# Patient Record
Sex: Male | Born: 1965 | Hispanic: No | Marital: Married | Smoking: Never smoker
Health system: Southern US, Community
[De-identification: ages and names within clinical notes are randomized; demographics above are authoritative.]

## PROBLEM LIST (undated history)

## (undated) DIAGNOSIS — I10 Essential (primary) hypertension: Secondary | ICD-10-CM

## (undated) DIAGNOSIS — Z85818 Personal history of malignant neoplasm of other sites of lip, oral cavity, and pharynx: Secondary | ICD-10-CM

## (undated) DIAGNOSIS — Z923 Personal history of irradiation: Secondary | ICD-10-CM

## (undated) HISTORY — DX: Personal history of irradiation: Z92.3

## (undated) HISTORY — DX: Essential (primary) hypertension: I10

## (undated) HISTORY — PX: PAROTIDECTOMY: SHX2163

## (undated) HISTORY — DX: Personal history of malignant neoplasm of other sites of lip, oral cavity, and pharynx: Z85.818

---

## 2014-04-22 ENCOUNTER — Ambulatory Visit: Payer: Self-pay | Admitting: General Practice

## 2015-11-11 ENCOUNTER — Ambulatory Visit (INDEPENDENT_AMBULATORY_CARE_PROVIDER_SITE_OTHER): Payer: 59

## 2015-11-11 ENCOUNTER — Ambulatory Visit (INDEPENDENT_AMBULATORY_CARE_PROVIDER_SITE_OTHER): Payer: 59 | Admitting: Podiatry

## 2015-11-11 ENCOUNTER — Encounter: Payer: Self-pay | Admitting: Podiatry

## 2015-11-11 VITALS — BP 141/89 | HR 83 | Resp 18

## 2015-11-11 DIAGNOSIS — M722 Plantar fascial fibromatosis: Secondary | ICD-10-CM

## 2015-11-11 DIAGNOSIS — R52 Pain, unspecified: Secondary | ICD-10-CM

## 2015-11-11 NOTE — Progress Notes (Signed)
   Subjective:    Patient ID: Jerome Villarreal, male    DOB: [birth year], 50 y.o.   MRN: [id number]  HPI  50 year old male presents the office they for concerns of right arch pain as well as heel pain which has been ongoing for about 1 week. He states he has pain only the mornings he first gets up and is better after he walks. He is going not experiencing any pain. No recent injury or trauma. No swelling or redness. No numbness or tingling. No treatment. No other complaints.   Review of Systems  All other systems reviewed and are negative.      Objective:   Physical Exam General: AAO x3, NAD  Dermatological: Skin is warm, dry and supple bilateral. Nails x 10 are well manicured; remaining integument appears unremarkable at this time. There are no open sores, no preulcerative lesions, no rash or signs of infection present.  Vascular: Dorsalis Pedis artery and Posterior Tibial artery pedal pulses are 2/4 bilateral with immedate capillary fill time. Pedal hair growth present. No varicosities and no lower extremity edema present bilateral. There is no pain with calf compression, swelling, warmth, erythema.   Neruologic: Grossly intact via light touch bilateral. Vibratory intact via tuning fork bilateral. Protective threshold with Semmes Wienstein monofilament intact to all pedal sites bilateral. Patellar and Achilles deep tendon reflexes 2+ bilateral. No Babinski or clonus noted bilateral.   Musculoskeletal: This time there is no tenderness the right foot. Subjectively along the medial band of the plantar fascia within the arch of the foot is were he gets discomfort as well as the plantar medial tubercle of the calcaneus at the insertion of plantar fascia however again he has night splint and pain into these levels. There is no edema, erythema, increase in warmth. There is no pain with lateral compression of the calcaneus. MMT 5/5. Equinus is present.  Gait: Unassisted, Nonantalgic.         Assessment & Plan:  50 year old male right plantar fasciitis, arch pain -Treatment options discussed including all alternatives, risks, and complications -Etiology of symptoms were discussed -X-rays were obtained and reviewed with the patient. No evidence of acute fracture. -At this time since he is not having pain hold off on steroid injection. -I discussed with him shoe gear modifications and orthotics to help better control his foot type. -Recommended daily stretching exercises I dispensed night splint. -Anti-inflammatories as needed. He did not wish to have a prescription for this. -Follow-up as scheduled or sooner if needed.  Ovid CurdMatthew Wagoner, DPM

## 2015-11-11 NOTE — Patient Instructions (Signed)

## 2015-11-15 DIAGNOSIS — M722 Plantar fascial fibromatosis: Secondary | ICD-10-CM | POA: Insufficient documentation

## 2015-12-14 ENCOUNTER — Ambulatory Visit (INDEPENDENT_AMBULATORY_CARE_PROVIDER_SITE_OTHER): Payer: 59 | Admitting: Podiatry

## 2015-12-14 ENCOUNTER — Encounter: Payer: Self-pay | Admitting: Podiatry

## 2015-12-14 VITALS — BP 134/88 | HR 69 | Resp 12

## 2015-12-14 DIAGNOSIS — M79673 Pain in unspecified foot: Secondary | ICD-10-CM

## 2015-12-14 DIAGNOSIS — M722 Plantar fascial fibromatosis: Secondary | ICD-10-CM

## 2015-12-14 NOTE — Progress Notes (Signed)
Patient ID: Jerome Villarreal, male   DOB: December 14, 1965, 50 y.o.   MRN: [id number]  Subjective: [age] male presents the office they for follow-up evaluation of right arch, heel pain. He states that he has not had any pain to his foot and the last couple weeks. He was stretching icing as well as wearing the splint for the first couple weeks however since his pain has subsided he has discontinued this. He did purchase power steps and this has been helping quite a bit.Denies any systemic complaints such as fevers, chills, nausea, vomiting. No acute changes since last appointment, and no other complaints at this time.   Objective: AAO x3, NAD DP/PT pulses palpable bilaterally, CRT less than 3 seconds There is no enderness to palpation along the plantar medial tubercle of the calcaneus at the insertion of plantar fascia on the rightfoot. There is no pain along the course of the plantar fascia within the arch of the foot. Plantar fascia appears to be intact. There is no pain with lateral compression of the calcaneus or pain with vibratory sensation. There is no pain along the course or insertion of the achilles tendon. No other areas of tenderness to bilateral lower extremities. No areas of pinpoint bony tenderness or pain with vibratory sensation. MMT 5/5, ROM WNL. No edema, erythema, increase in warmth to bilateral lower extremities.  No open lesions or pre-ulcerative lesions.  No pain with calf compression, swelling, warmth, erythema  Assessment: Resolved right foot pain.  Plan: -All treatment options discussed with the patient including all alternatives, risks, complications.  -Discussed ways to help prevent recurrence. Continue with orthotics. Continue stretching exercises. Also discussed shoe gear changes. If it any time the symptoms are to recur to call the office. Follow up as needed this point. -Patient encouraged to call the office with any questions, concerns, change in symptoms.   Jerome Villarreal,  DPM

## 2016-07-21 IMAGING — MR RADIOLOGY EXAMINATION
4 of 8 series · 28 of 48 positions shown · IV contrast (multihance)
Comparison: None.

CLINICAL DATA: Right-sided face and neck pain.  Some swelling.

EXAM:
MRI FACE TRIGEMINAL WITHOUT AND WITH CONTRAST
TECHNIQUE: Multiplanar, multiecho pulse sequences of the face and surrounding
structures, including thin slice imaging of the course of the
Trigeminal Nerves, were obtained both before and after
administration of intravenous contrast.
CONTRAST:  13 cc MultiHance

[Series 2: T2 · coronal · 3.0mm · 0.60mm/px · 6 of 45 slices shown (1 of 2)]
[im 1/45]
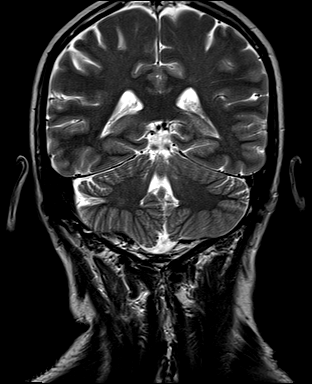
[im 9/45]
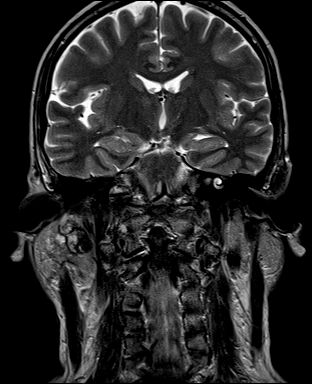
[im 18/45]
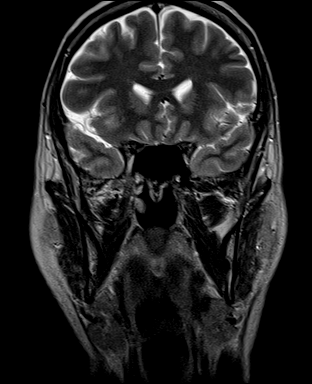
[im 27/45]
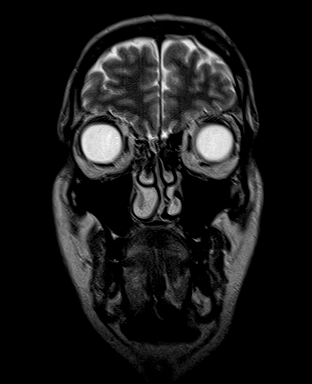
[im 36/45]
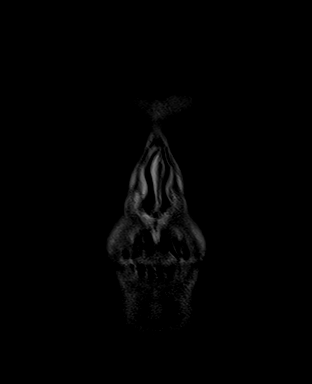
[im 45/45]
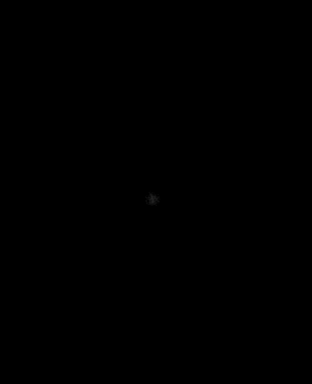

[Series 4: T2 · axial · 1.5mm · 0.35mm/px · z∈[-135,-4]mm · 11 of 88 slices shown (2 of 2)]
[im 1/88]
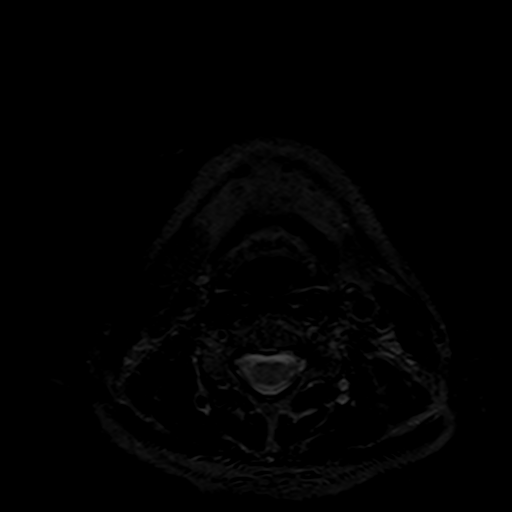
[im 9/88]
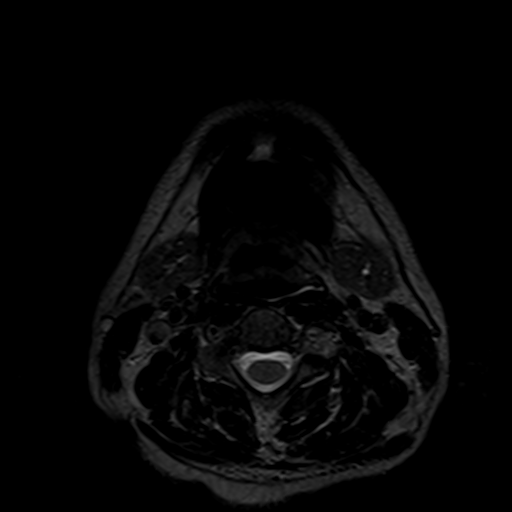
[im 18/88]
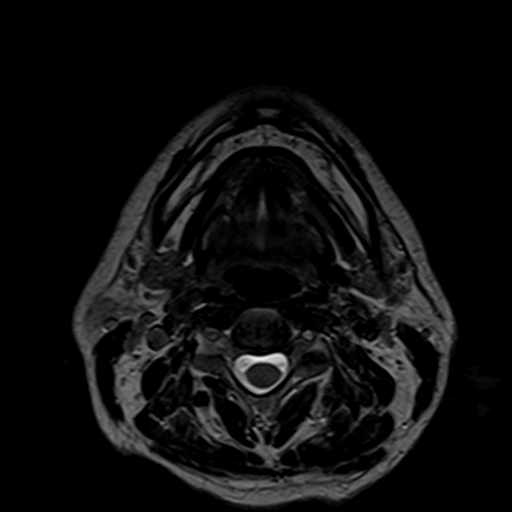
[im 27/88]
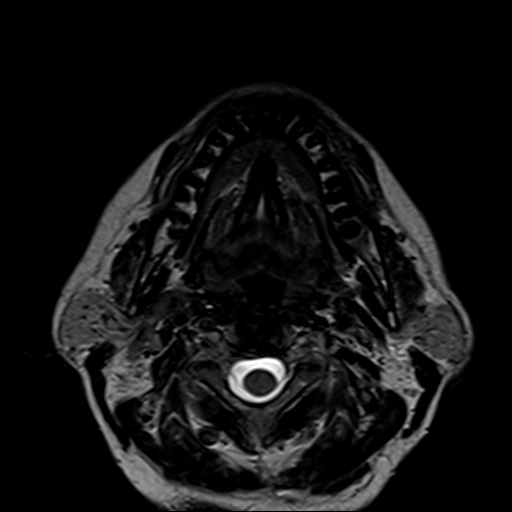
[im 35/88]
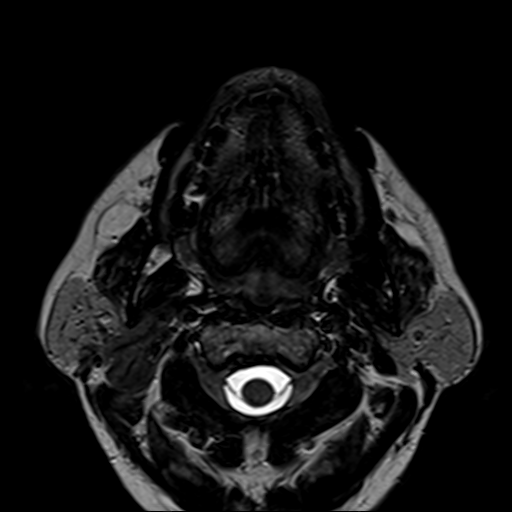
[im 44/88]
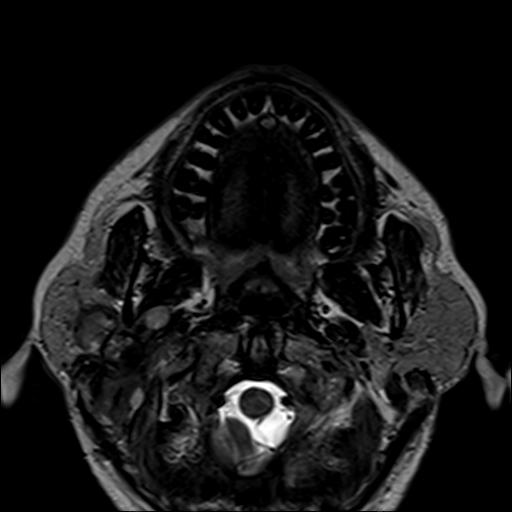
[im 53/88]
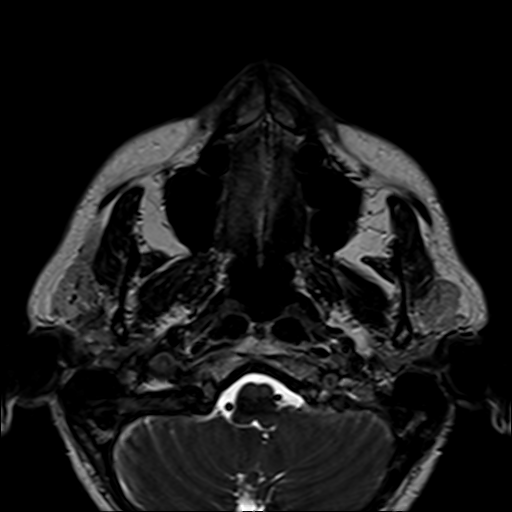
[im 61/88]
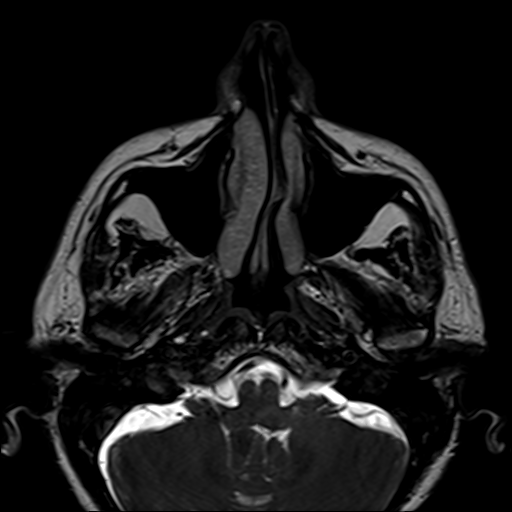
[im 70/88]
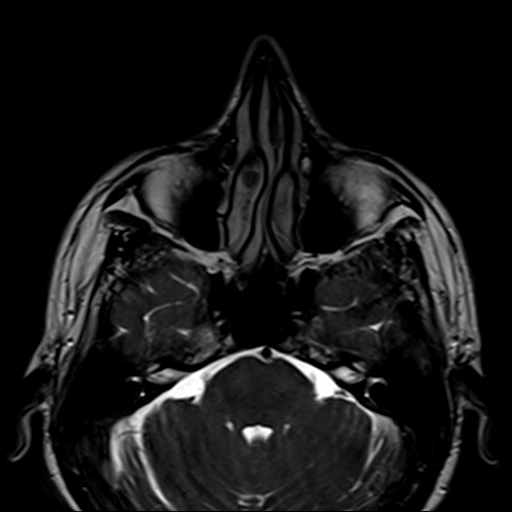
[im 79/88]
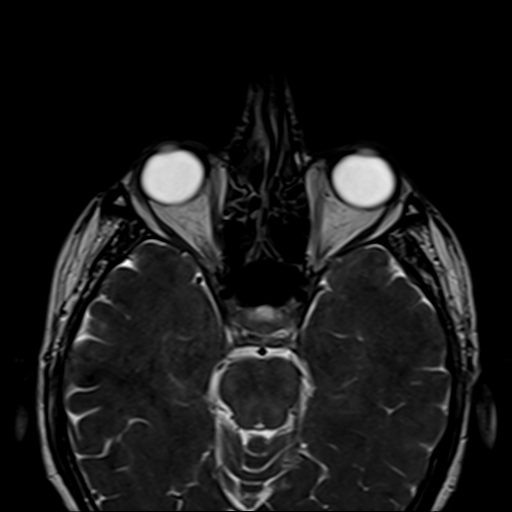
[im 88/88]
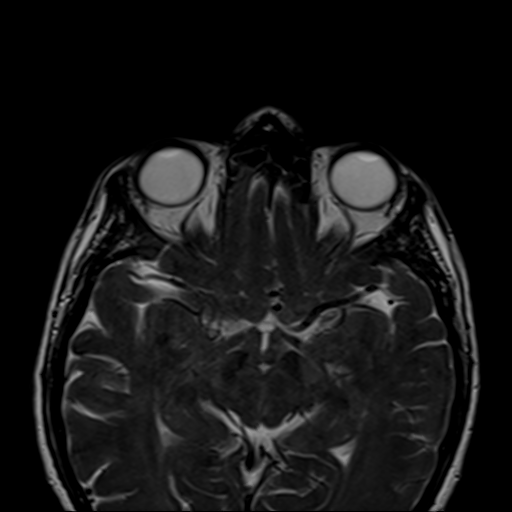

[Series 5: T1 · axial · 3.0mm · 0.35mm/px · z∈[-134,-5]mm · 3 of 37 slices shown]
[im 1/37]
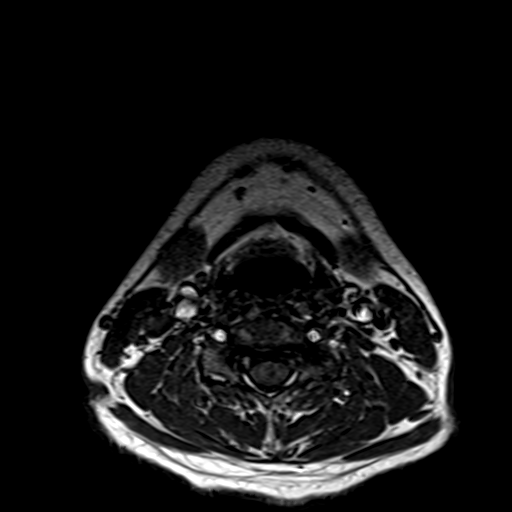
[im 25/37]
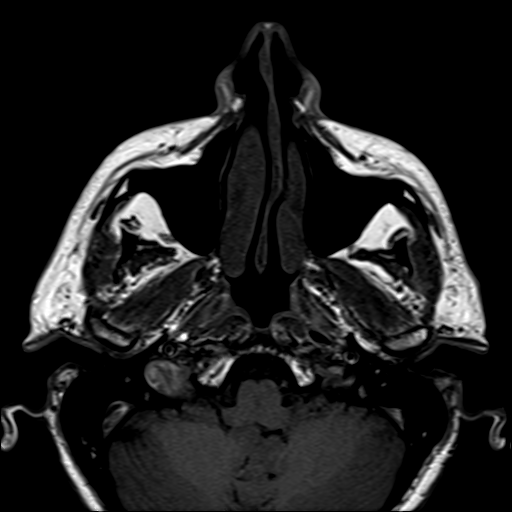
[im 37/37]
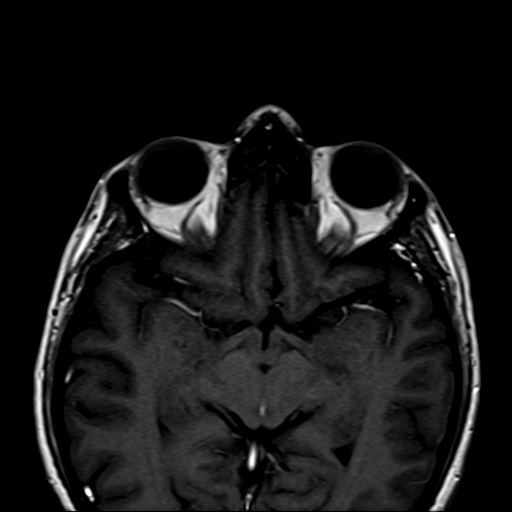

[Series 9: T1 post-contrast · axial · 3.0mm · 1.00mm/px · z∈[-70,+117]mm · 8 of 64 slices shown]
[im 1/64]
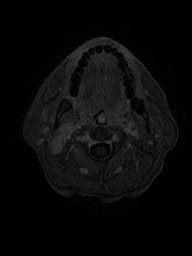
[im 10/64]
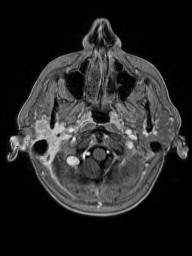
[im 19/64]
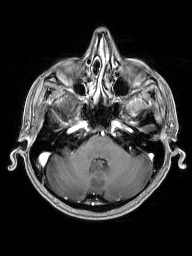
[im 28/64]
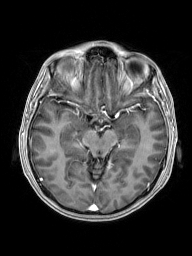
[im 37/64]
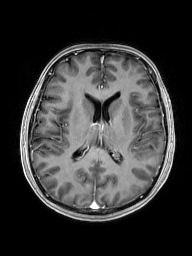
[im 46/64]
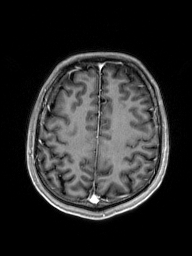
[im 55/64]
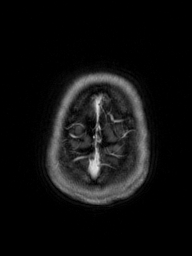
[im 64/64]
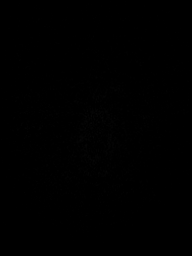

[28 of 48 positions shown; findings below may reference images not displayed]

FINDINGS: There is an invasive mass arising at the superior aspect of the
junction of the deep and superficial lobes of the right parotid with
extensive regional invasion. Overall measurements are approximately
4.6 x 4.9 x 5.7 cm. More superficial component of the tumor appears
somewhat lobular and well-circumscribed, but the deep extension
appears more diffusely infiltrative. There is extension to the
stylomastoid foramen and probable extension along the seventh nerve
on the right. There is an adjacent 9 mm lymph node at the edge of
the deep lobe of the parotid, likely involved by tumor. Tumor
completely surrounds the mastoid tip. Tumor extends around the
posterior margin of the mandible and extends to the border of the
right temporomandibular joint. There is extrinsic compression of the
right internal jugular vein. Tumor extends to the margin of the
carotid space. Early carotid space invasion may be present
inferiorly.

No intracranial metastasis is identified.

No abnormality of the left side of the face.
IMPRESSION: Approximately 4.6 x 4.9 x 5.7 cm malignancy arising at the junction
of the superficial and deep lobes of the parotid on the right, with
local regional invasion including extension along the right seventh
nerve in the stylomastoid foramen, involvement of an adjacent deep
lymph node, compression of the right internal jugular vein, possible
early invasion of the carotid space inferiorly, and immediate
contiguity with the posterior aspect of the mandible and
temporomandibular joint on the right. Most likely diagnosis is
adenoid cystic carcinoma. Aggressive mucoepidermoid carcinomas can
have this appearance.

## 2019-02-24 ENCOUNTER — Other Ambulatory Visit: Payer: Self-pay

## 2019-02-24 DIAGNOSIS — Z20822 Contact with and (suspected) exposure to covid-19: Secondary | ICD-10-CM

## 2019-02-26 LAB — NOVEL CORONAVIRUS, NAA: SARS-CoV-2, NAA: NOT DETECTED

## 2021-06-24 LAB — COLOGUARD: COLOGUARD: NEGATIVE

## 2022-02-06 ENCOUNTER — Emergency Department
Admission: EM | Admit: 2022-02-06 | Discharge: 2022-02-06 | Disposition: A | Discharge status: Home / Self Care | Payer: 59 | Attending: Emergency Medicine | Admitting: Emergency Medicine

## 2022-02-06 ENCOUNTER — Other Ambulatory Visit: Payer: Self-pay

## 2022-02-06 ENCOUNTER — Encounter: Payer: Self-pay | Admitting: Emergency Medicine

## 2022-02-06 DIAGNOSIS — R040 Epistaxis: Secondary | ICD-10-CM | POA: Insufficient documentation

## 2022-02-06 HISTORY — DX: Personal history of irradiation: Z92.3

## 2022-02-06 HISTORY — DX: Personal history of malignant neoplasm of other sites of lip, oral cavity, and pharynx: Z85.818

## 2022-02-06 MED ORDER — OXYMETAZOLINE HCL 0.05 % NA SOLN
1.0000 | Freq: Once | NASAL | Status: AC
  Administered 2022-02-06: 1 via NASAL
  Filled 2022-02-06: qty 30

## 2022-02-06 NOTE — ED Triage Notes (Addendum)
Pt has had 3 episodes of epistaxis since yesterday most recent this morning.  Dried blood noted to left nare but no visible active bleeding at this time.  No injury.  Ambulatory without difficulty. Not on blood thinners or any antiplatelet medication

## 2022-02-06 NOTE — Discharge Instructions (Signed)
Follow-up with your regular doctor as needed.  Return emergency department worsening. Use the Afrin twice daily for 3 to 4 days.  Apply ice if you begin to bleed.  Apply the nose clamp for 20 minutes.  If you continue to bleed after 20 minutes you should come to the emergency department

## 2022-02-06 NOTE — ED Provider Notes (Signed)
[facility] Provider Note    Event Date/Time   First MD Initiated Contact with Patient 02/06/22 [phone]     (approximate)   History   Epistaxis   HPI  Jerome Villarreal is a 56 y.o. male with left-sided nasal bleeding presents emergency department with concerns as he has had 3 episodes of epistaxis since yesterday.  Patient has had multiple facial reconstruction surgeries.  Those are on the right side.      Physical Exam   Triage Vital Signs: ED Triage Vitals  Enc Vitals Group     BP 02/06/22 0845 (!) 147/91     Pulse Rate 02/06/22 0845 97     Resp 02/06/22 0845 18     Temp 02/06/22 0845 98.5 F (36.9 C)     Temp Source 02/06/22 0845 Oral     SpO2 02/06/22 0845 98 %     Weight 02/06/22 0844 145 lb (65.8 kg)     Height 02/06/22 0844 5\' 7"  (1.702 m)     Head Circumference --      Peak Flow --      Pain Score 02/06/22 0843 1     Pain Loc --      Pain Edu? --      Excl. in GC? --     Most recent vital signs: Vitals:   02/06/22 0845  BP: (!) 147/91  Pulse: 97  Resp: 18  Temp: 98.5 F (36.9 C)  SpO2: 98%     General: Awake, no distress.   CV:  Good peripheral perfusion. regular rate and  rhythm Resp:  Normal effort. Lungs CTA Abd:  No distention.   Other:     ED Results / Procedures / Treatments   Labs (all labs ordered are listed, but only abnormal results are displayed) Labs Reviewed - No data to display   EKG     RADIOLOGY     PROCEDURES:   Procedures   MEDICATIONS ORDERED IN ED: Medications  oxymetazoline (AFRIN) 0.05 % nasal spray 1 spray (1 spray Each Nare Given 02/06/22 0949)     IMPRESSION / MDM / ASSESSMENT AND PLAN / ED COURSE  I reviewed the triage vital signs and the nursing notes.                              Differential diagnosis includes, but is not limited to, epistaxis, sinusitis, contusion abrasion  Patient's presentation is most consistent with acute, uncomplicated illness.   Physical  exam shows some irritation and bleeding of the anterior left nostril, no active bleeding noted, due to the oozing and will have the nursing staff apply Afrin.  We will wait 20 minutes the patient has no further bleeding we will then discharge to home with follow-up to his regular doctor.   On reexamination the patient there is no additional bleeding.  Did discuss use of Afrin and Vaseline.  Appears to be very anterior and most likely from nose picking.  Patient was given a nose clamp to use if he has additional bleeding.  He was instructed to use these for 20 minutes and if has continued bleeding to return emergency department.  Patient is in agreement with treatment plan.  He was discharged stable condition.   FINAL CLINICAL IMPRESSION(S) / ED DIAGNOSES   Final diagnoses:  Anterior epistaxis     Rx / DC Orders   ED Discharge Orders     None  Note:  This document was prepared using Dragon voice recognition software and may include unintentional dictation errors.    Faythe Ghee, PA-C 02/06/22 1017    Sharyn Creamer, MD 02/06/22 [phone]

## 2024-06-09 NOTE — Telephone Encounter (Signed)
 Patient calling to follow up about results, requesting an appointment with Dr. Delayne.   Call back # [phone]

## 2024-06-17 NOTE — Progress Notes (Signed)
 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 Graig given significant metastatic disease. Patient would like to attempt procedure to help with his voice. Still very bothered by his voice. Has interval increase in size of lesion at skull base and lungs per recent imaging report.    CT chest: IMPRESSION: 1.  Interval increase in size of numerous bilateral pulmonary nodules as detailed above. 2.  Indeterminate hepatic segment 7 lesion favored to be subtle and variable in appearance on priors. While this can be seen with hemangiomas, recommend further characterization with contrast-enhanced MRI abdomen given the clinical context.  CT neck:  IMPRESSION: 1.  New effacement of fat within the foramen for the right inferior alveolar nerve. Given history of adenoid cystic carcinoma this raises the possibility of early perineural tumor spread. Advise correlation for pain and numbness in this nerve distribution. Attention on follow-up imaging is advised. Alternatively, MRI of the face perineural tumor protocol could further evaluate. 2.  Increased bulk of abnormal soft tissue in the right hypotympanum which abuts and may surround portions of the stapes superstructure. This could represent increased local extent of neoplastic disease. Correlate for evidence of a conductive hearing loss.  3.  Otherwise, the amorphous soft tissue in the parotidectomy bed and adjacent skull base osseous destructive changes are similar to the prior CT dated 09/14/2023 but increased from 2024. 4.  Findings of right vocal cord paresis are  redemonstrated, likely related to involvement of the right vagus nerve in the suprahyoid neck. 5.  Please reference separate CT chest same day for description of intrathoracic metastatic disease.   These findings were discussed with Dr. Rhea by Dr. Fabio via telephone on 05/22/2024 4:55 PM.    Patient's medications, allergies, past medical, surgical, social and family histories were reviewed and updated as appropriate.  REVIEW OF SYSTEMS: I have reviewed the patient reported information the intake form on today's visit.  The patient completed pertinent review of systems (which was scanned into their medical record).  Otherwise the review of systems was negative.  Medical History[1] Surgical History[2] Family History[3] Social History   Tobacco Use   Smoking status: Former    Current packs/day: 0.00    Types: Cigarettes    Quit date: 05/13/1994    Years since quitting: 30.1   Smokeless tobacco: Never  Substance Use Topics   Alcohol use: Yes    Alcohol/week: 6.0 - 7.0 standard drinks of alcohol    Comment: 1 beer most nights per pt   Allergies[4] Current Medications[5]  PHYSICAL EXAM:  VS: Blood pressure (!) 143/95, pulse 91, SpO2 98%. Constitutional: Pleasant, well-developed, well-nourished patient in no apparent distress. Mental status is normal, patient able to communicate.  Breathing quietly, comfortably, no stridor or wheezing. Vital signs - see nursing note. Voice quality unchanged.  Head and Face: No skin lesions Nose: Anterior rhinoscopy reveals no masses or lesions. Nasal mucosa appears healthy.  Oral Cavity/Oropharynx: No masses or lesions. Very poor mouth opening. Posterior pharyngeal wall is normal.  Larynx and nasopharynx examined by scope (see procedure note). Neck/Lymphatic: post surgical changes. Respiratory: No audible stridor,  no shortness of air, normal rate.    PROCEDURE:  Video Laryngostroboscopy (VLS) Report (CPT [phone])  A transnasal video  laryngostroboscopy (VLS) was performed to closely evaluate the patient's laryngeal biomechanics and vocal fold oscillation.   VLS Exam Detail:  Amplitude - Left: normal Amplitude - Right: increased  Mucosal Wave - Left: normal Mucosal Wave - Right: increased  Vocal Fold Motion - Left: Normal Vocal Fold Motion - Right: absent  Vertical Level of Approximation: equal Glottic Closure: transglottic  Phase Symmetry: asymmetric Periodicity (Regularity): Fairly regular  Supraglottic Hyperfunction: mixed Nasopharynx: Bilateral tori wnl, no masses or lesions  Airway: Widely Patent Additional Observations: Right Vocal Fold Immobility, copious pooled secretions   Exam Summary: Examiner: Jerome L. Delayne, DO Diagnostician: Jerome L. Delayne, DO  Endoscope Used:  Pentax distal chip flexible laryngoscope [id number]  Exam Type: Transnasal videolaryngoscopy  Anesthesia: Yes, Topical (oxymetazoline  and 4% lidocaine) Sensitivity to Scope: no       ASSESSMENT:  Jerome Villarreal is a 59 y.o. male with   1. Vocal fold paralysis, right      2. Pharyngeal dysphagia         PLAN: I have discussed the above assessment with patient and/or caregiver.  I have recommended: I discussed a vocal fold injection performed in the office with the patient. I did discuss with the patient in depth regarding this procedure and its risks, benefits, and alternatives. Risks include but are not limited to infection, bleeding, scarring, nosebleed, vasovagal reaction, persistent or worsening hoarseness, need for further procedures or need to terminate the procedure in the office due to the patient's inability to tolerate the procedure. Alternatively, this procedure may be performed in the operating room, under general anesthesia, as a same-day surgery but given patient's very poor mouth opening I think that best to do awake. All of the patient's questions were answered. The patient wishes to proceed with an in-office injection  with Prolaryn PLUS.   I have asked the patient to follow up in 1 month post op or sooner if needed.        [1] Past Medical History: Diagnosis Date   Cancer    (CMD)    Hypertension    Malignant neoplasm metastatic to lung    (CMD) 01/25/2019   Pain    on right side of face   Skin lumps    behind ear  [2] Past Surgical History: Procedure Laterality Date   CAROTID ENDARTERECTOMY     EXTERNAL EAR SURGERY Right 05/08/2017   Procedure: Excision antihelical fold for canal stenosis.  30 minutes;  Surgeon: Lynwood Cheryl Inks, MD;  Location[facility];  Service: ENT;  Laterality: Right;   GOLD WEIGHT IMPLANTATION EYELID Right 12/14/2021   Procedure: EYELID WEIGHT ( Platinum)  IMPLANTATION;  Surgeon: Lynwood Cheryl Inks, MD;  Location: [facility];  Service: ENT;  Laterality: Right;  1.4 gm plantinum weight   LUNG SURGERY Right 04/01/2019   Procedure: RIGHT ROBOTIC ASSISTED WEDGE RESECTION OF RIGHT UPPER LOBE X2, RIGHT MIDDLE LOBE X1 & RIGHT LOWER LOBE X1.;  Surgeon: Rodgers Lynwood Jannell Raddle., MD;  Location: [facility] MA[facility] OR;  Service: Cardiothoracic;  Laterality: Right;   PAROTIDECTOMY Right 05/26/2014   Procedure: RIGHT PAROTIDECTOMY, right partial temporal bone resection, facial nerve graft, gold weight implant;  Surgeon: Lynwood Cheryl Inks, MD;  Location: [facility] MAIN OR;  Service: ENT;  Laterality: Right;   UROLIFT INSERTION  03/13/2019   Procedure: UROLIFT INSERTION   WISDOM TOOTH EXTRACTION  Procedure: WISDOM TOOTH EXTRACTION  [3] Family History Problem Relation Name Age of Onset   Tuberculosis Mother     Cancer Father PS [place]    Anesthesia problems Neg Hx    [4] No Known Allergies [5] Current Outpatient Medications  Medication Sig Dispense Refill   amLODIPine (NORVASC) 10 mg tablet Take 1 tablet by mouth daily.     ergocalciferol (VITAMIN D2) 1,250 mcg (50,000 unit) capsule Take 50,000 Units by mouth once a week.     ibuprofen (MOTRIN) 200 mg tablet Take 200 mg  by mouth as needed (pain).     krill-om3-dha-epa-om6-lip-astx (Krill Oil, Omega 3 and 6,) 1000-130(40-80) mg cap Take 1 tablet by mouth daily.     melatonin tablet Take 5 mg by mouth daily as needed for sleep.     NON FORMULARY Ocerin 1 Cap for arthritis pain     SF 1.1 % gel Take 1 Application by mouth nightly.     betamethasone diproprionate, augmented (DIPROLENE AF) 0.05 % oint Apply to affected ear as directed at night for dermatitis (Patient not taking: Reported on 06/17/2024) 15 g 1   NON FORMULARY Administer 2 sprays into affected nostril(s) daily. Perrigo nasal spray (Patient not taking: Reported on 06/17/2024)     No current facility-administered medications for this visit.

## 2024-07-04 ENCOUNTER — Other Ambulatory Visit: Payer: Self-pay

## 2024-07-04 ENCOUNTER — Emergency Department

## 2024-07-04 ENCOUNTER — Emergency Department
Admission: EM | Admit: 2024-07-04 | Discharge: 2024-07-05 | Disposition: A | Attending: Emergency Medicine | Admitting: Emergency Medicine

## 2024-07-04 DIAGNOSIS — R55 Syncope and collapse: Secondary | ICD-10-CM | POA: Diagnosis not present

## 2024-07-04 DIAGNOSIS — R040 Epistaxis: Secondary | ICD-10-CM | POA: Insufficient documentation

## 2024-07-04 LAB — COMPREHENSIVE METABOLIC PANEL WITH GFR
ALT: 12 U/L (ref 0–44)
AST: 27 U/L (ref 15–41)
Albumin: 4.1 g/dL (ref 3.5–5.0)
Alkaline Phosphatase: 69 U/L (ref 38–126)
Anion gap: 12 (ref 5–15)
BUN: 18 mg/dL (ref 6–20)
CO2: 24 mmol/L (ref 22–32)
Calcium: 8.8 mg/dL — ABNORMAL LOW (ref 8.9–10.3)
Chloride: 103 mmol/L (ref 98–111)
Creatinine, Ser: 0.76 mg/dL (ref 0.61–1.24)
GFR, Estimated: >60 mL/min
Glucose, Bld: 151 mg/dL — ABNORMAL HIGH (ref 70–99)
Potassium: 3.8 mmol/L (ref 3.5–5.1)
Sodium: 139 mmol/L (ref 135–145)
Total Bilirubin: 1 mg/dL (ref 0.0–1.2)
Total Protein: 7.1 g/dL (ref 6.5–8.1)

## 2024-07-04 LAB — CBC WITH DIFFERENTIAL/PLATELET
Abs Immature Granulocytes: 0.03 10*3/uL (ref 0.00–0.07)
Basophils Absolute: 0 10*3/uL (ref 0.0–0.1)
Basophils Relative: 0 %
Eosinophils Absolute: 0 10*3/uL (ref 0.0–0.5)
Eosinophils Relative: 0 %
HCT: 37.6 % — ABNORMAL LOW (ref 39.0–52.0)
Hemoglobin: 13.1 g/dL (ref 13.0–17.0)
Immature Granulocytes: 0 %
Lymphocytes Relative: 11 %
Lymphs Abs: 1 10*3/uL (ref 0.7–4.0)
MCH: 31.9 pg (ref 26.0–34.0)
MCHC: 34.8 g/dL (ref 30.0–36.0)
MCV: 91.5 fL (ref 80.0–100.0)
Monocytes Absolute: 0.9 10*3/uL (ref 0.1–1.0)
Monocytes Relative: 10 %
Neutro Abs: 6.9 10*3/uL (ref 1.7–7.7)
Neutrophils Relative %: 79 %
Platelets: 169 10*3/uL (ref 150–400)
RBC: 4.11 MIL/uL — ABNORMAL LOW (ref 4.22–5.81)
RDW: 12.3 % (ref 11.5–15.5)
WBC: 8.8 10*3/uL (ref 4.0–10.5)
nRBC: 0 % (ref 0.0–0.2)

## 2024-07-04 MED ORDER — SODIUM CHLORIDE 0.9 % IV BOLUS
1000.0000 mL | Freq: Once | INTRAVENOUS | Status: AC
  Administered 2024-07-04: 1000 mL via INTRAVENOUS

## 2024-07-04 NOTE — ED Notes (Signed)
 Pt to ed from home via [facility] for nose bleed. Pt been picking his nose with a Qtip and maybe got too far.  158/106

## 2024-07-04 NOTE — ED Notes (Addendum)
 Someone yelled for help and advised pt was lying on the floor. Pt had fallen out of his wheelchair and was laying right lateral. Blood coming from his nose and mouth. Pt assisted back to chair and moved to triage middle where he had a witnessed seizure and agonal respirations. Pt was bleeding from the mouth. Deviated eye gaze, non-responding and posturing. Pt immediately taken to room 1.

## 2024-07-04 NOTE — ED Notes (Signed)
 Patient transported to CT

## 2024-07-04 NOTE — ED Provider Notes (Signed)
 "  [facility] Provider Note    Event Date/Time   First MD Initiated Contact with Patient 07/04/24 2306     (approximate)   History   Epistaxis   HPI  Jerome Villarreal is a 59 y.o. 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  The patient reports acute onset of a nosebleed around 730 or 8 PM.  He states it is mainly out of the left nostril although some has come out of the right.  He has also been coughing up blood this evening.  He denies shortness of breath.  He denies any other abnormal bleeding or bruising.  He has had multiple nosebleeds in the past and has not required packing.  While waiting, the patient collapsed out of the wheelchair and had an apparent witnessed seizure.  However when I arrived in the room the patient was alert and did not appear postictal.  He denies any prior seizure history.    nosebleed since 730 or 8 PM, mainly out of the left nostril, also coughing up blood.  Has had nosebleeds before but has not required packing.  While waiting, the patient fell out of his wheelchair and was lying on his right side.  He had a witnessed seizure.     Physical Exam   Triage Vital Signs: ED Triage Vitals  Encounter Vitals Group     BP 07/04/24 2221 (!) 126/98     Girls Systolic BP Percentile --      Girls Diastolic BP Percentile --      Boys Systolic BP Percentile --      Boys Diastolic BP Percentile --      Pulse Rate 07/04/24 2221 (!) 105     Resp 07/04/24 2221 18     Temp 07/04/24 2221 98.1 F (36.7 C)     Temp Source 07/04/24 2221 Oral     SpO2 07/04/24 2221 96 %     Weight --      Height 07/04/24 2211 5' 7 (1.702 m)     Head Circumference --      Peak Flow --      Pain Score 07/04/24 2211 0     Pain Loc --      Pain Education --      Exclude from Growth Chart --     Most recent vital signs: Vitals:   07/05/24 0130 07/05/24 0553  BP: 134/87 131/80   Pulse: (!) 115 93  Resp: (!) 22 18  Temp:  98.2 F (36.8 C)  SpO2: 100% 99%     General: Awake, no distress.  CV:  Good peripheral perfusion.  Resp:  Normal effort.  Lungs CTAB. Abd:  No distention.  Other:  Large amount of dried blood around the left nare and on the face.  Left nasal passage with stigmata of recent bleeding.  Right nasal passage with no evidence of active bleeding.  Oropharynx clear.  Motor intact in all extremities.  Normal speech.  No facial droop.   ED Results / Procedures / Treatments   Labs (all labs ordered are listed, but only abnormal results are displayed) Labs Reviewed  COMPREHENSIVE METABOLIC PANEL WITH GFR - Abnormal; Notable for the following components:      Result Value   Glucose, Bld 151 (*)    Calcium 8.8 (*)    All other components within normal limits  CBC WITH DIFFERENTIAL/PLATELET - Abnormal; Notable  for the following components:   RBC 4.11 (*)    HCT 37.6 (*)    All other components within normal limits  TROPONIN T, HIGH SENSITIVITY  TROPONIN T, HIGH SENSITIVITY     EKG  ED ECG REPORT I, Waylon Cassis, the attending physician, personally viewed and interpreted this ECG.  Date: 07/05/2024 EKG Time: 2341 Rate: 87 Rhythm: normal sinus rhythm QRS Axis: normal Intervals: normal ST/T Wave abnormalities: normal Narrative Interpretation: no evidence of acute ischemia    RADIOLOGY  Chest x-ray: I independently viewed and interpreted the images: There are opacities consistent with metastases but no acute abnormality  CT head: No ICH   CT angio chest:  IMPRESSION:  1. No evidence of acute pulmonary embolism, limited by suboptimal opacification  of the pulmonary arteries.  2. Multiple bilateral pulmonary nodules compatible with metastatic disease.  3. Postoperative appearance of the right lung compatible with multiple prior  wedge resections.      PROCEDURES:  Critical Care performed:  No  Procedures   MEDICATIONS ORDERED IN ED: Medications  sodium chloride  0.9 % bolus 1,000 mL (0 mLs Intravenous Stopped 07/05/24 0122)  oxymetazoline  (AFRIN) 0.05 % nasal spray 1 spray (1 spray Each Nare Given 07/05/24 0209)  iohexol  (OMNIPAQUE ) 350 MG/ML injection 75 mL (75 mLs Intravenous Contrast Given 07/05/24 0221)     IMPRESSION / MDM / ASSESSMENT AND PLAN / ED COURSE  I reviewed the triage vital signs and the nursing notes.  59 year old male with PMH as noted above including metastatic cancer to the lungs presents with a nosebleed as well as some hemoptysis.  While waiting he had a possible syncopal episode versus seizure.  He is not postictal.  On exam his vital signs are normal except for borderline tachycardia.  There is evidence of bleeding in the left nasal passage although bleeding has been controlled with pressure.  Differential diagnosis includes, but is not limited to, anterior epistaxis, less likely posterior epistaxis.  The hemoptysis may just be blood that he has swallowed or aspirated from the epistaxis, however this also could be related to his lung metastases or an acute infectious process.  We will obtain basic labs, chest x-ray, CT head given the possible seizure and head injury, and reassess.  Patient's presentation is most consistent with acute presentation with potential threat to life or bodily function.  The patient is on the cardiac monitor to evaluate for evidence of arrhythmia and/or significant heart rate changes.   ----------------------------------------- 6:07 AM on 07/05/2024 -----------------------------------------  The patient had no recurrence of the epistaxis and the clip was removed.  Lab workup is unremarkable.  CBC shows a normal hemoglobin and CMP is normal.  CT head was negative.  Chest x-ray showed findings consistent with metastatic disease.  However, given the possibility of hemoptysis I discussed with the patient obtaining a CTA to rule out  PE and he was in agreement.  The CTA also shows metastatic disease with no evidence of PE.  At this time the patient has been in the ED for over 8 hours and has remained clinically stable with a resolved epistaxis and no other concerning acute symptoms.  He is eager to go home.  I suspect that the hemoptysis really was his coughing up blood that he had swallowed.  I counseled the patient on the results of the workup and plan of care.  I gave strict return precautions, and he expressed understanding.   FINAL CLINICAL IMPRESSION(S) / ED DIAGNOSES   Final diagnoses:  Anterior epistaxis  Syncope, unspecified syncope type     Rx / DC Orders   ED Discharge Orders     None        Note:  This document was prepared using Dragon voice recognition software and may include unintentional dictation errors.    Jacolyn Pae, MD 07/05/24 0715  "

## 2024-07-05 ENCOUNTER — Emergency Department

## 2024-07-05 LAB — TROPONIN T, HIGH SENSITIVITY
Troponin T High Sensitivity: 8 ng/L (ref 0–19)
Troponin T High Sensitivity: 9 ng/L (ref 0–19)

## 2024-07-05 MED ORDER — OXYMETAZOLINE HCL 0.05 % NA SOLN
1.0000 | Freq: Once | NASAL | Status: AC
  Administered 2024-07-05: 1 via NASAL
  Filled 2024-07-05: qty 30

## 2024-07-05 MED ORDER — IOHEXOL 350 MG/ML SOLN
75.0000 mL | Freq: Once | INTRAVENOUS | Status: AC | PRN
  Administered 2024-07-05: 75 mL via INTRAVENOUS

## 2024-07-05 NOTE — Discharge Instructions (Signed)
 As discussed, use Afrin nasal spray twice daily for the next 3 days.  Return to the ER immediately for new, worsening, or persistent severe nosebleed, coughing up blood, recurrent episodes of dizziness or lightheadedness, passing out, or any other new or worsening symptoms that concern you.

## 2024-07-05 NOTE — ED Notes (Signed)
 Pt resting comfrtably in bed with eyes closed. Respirations even and non-labored. No active bleeding noted at this time.
